# Patient Record
Sex: Female | Born: 1945 | Race: Black or African American | Hispanic: No
Health system: Southern US, Community
[De-identification: ages and names within clinical notes are randomized; demographics above are authoritative.]

## PROBLEM LIST (undated history)

## (undated) DIAGNOSIS — Z9289 Personal history of other medical treatment: Secondary | ICD-10-CM

## (undated) DIAGNOSIS — I10 Essential (primary) hypertension: Secondary | ICD-10-CM

## (undated) DIAGNOSIS — I639 Cerebral infarction, unspecified: Secondary | ICD-10-CM

## (undated) DIAGNOSIS — IMO0001 Reserved for inherently not codable concepts without codable children: Secondary | ICD-10-CM

## (undated) DIAGNOSIS — N184 Chronic kidney disease, stage 4 (severe): Secondary | ICD-10-CM

## (undated) DIAGNOSIS — E785 Hyperlipidemia, unspecified: Secondary | ICD-10-CM

## (undated) DIAGNOSIS — E041 Nontoxic single thyroid nodule: Secondary | ICD-10-CM

## (undated) DIAGNOSIS — Z794 Long term (current) use of insulin: Secondary | ICD-10-CM

## (undated) DIAGNOSIS — E119 Type 2 diabetes mellitus without complications: Secondary | ICD-10-CM

## (undated) HISTORY — PX: CATARACT EXTRACTION, BILATERAL: SHX1313

## (undated) HISTORY — DX: Hyperlipidemia, unspecified: E78.5

## (undated) HISTORY — DX: Personal history of other medical treatment: Z92.89

## (undated) HISTORY — DX: Essential (primary) hypertension: I10

## (undated) HISTORY — DX: Type 2 diabetes mellitus without complications: E11.9

## (undated) HISTORY — DX: Long term (current) use of insulin: Z79.4

## (undated) HISTORY — DX: Nontoxic single thyroid nodule: E04.1

## (undated) HISTORY — PX: ABDOMINAL HYSTERECTOMY: SHX81

## (undated) HISTORY — DX: Chronic kidney disease, stage 4 (severe): N18.4

## (undated) HISTORY — DX: Cerebral infarction, unspecified: I63.9

## (undated) HISTORY — DX: Reserved for inherently not codable concepts without codable children: IMO0001

---

## 2014-08-06 ENCOUNTER — Inpatient Hospital Stay: Payer: Self-pay | Admitting: Internal Medicine

## 2014-08-06 LAB — COMPREHENSIVE METABOLIC PANEL
ALT: 15 U/L
Albumin: 2.4 g/dL — ABNORMAL LOW (ref 3.4–5.0)
Alkaline Phosphatase: 134 U/L — ABNORMAL HIGH
Anion Gap: 10 (ref 7–16)
BILIRUBIN TOTAL: 0.3 mg/dL (ref 0.2–1.0)
BUN: 35 mg/dL — ABNORMAL HIGH (ref 7–18)
CREATININE: 2.44 mg/dL — AB (ref 0.60–1.30)
Calcium, Total: 9.4 mg/dL (ref 8.5–10.1)
Chloride: 106 mmol/L (ref 98–107)
Co2: 23 mmol/L (ref 21–32)
EGFR (African American): 23 — ABNORMAL LOW
EGFR (Non-African Amer.): 20 — ABNORMAL LOW
Glucose: 302 mg/dL — ABNORMAL HIGH (ref 65–99)
OSMOLALITY: 297 (ref 275–301)
Potassium: 4.2 mmol/L (ref 3.5–5.1)
SGOT(AST): 21 U/L (ref 15–37)
Sodium: 139 mmol/L (ref 136–145)
TOTAL PROTEIN: 7.2 g/dL (ref 6.4–8.2)

## 2014-08-06 LAB — HEMOGLOBIN A1C: HEMOGLOBIN A1C: 11.6 % — AB (ref 4.2–6.3)

## 2014-08-06 LAB — CBC
HCT: 32.4 % — AB (ref 35.0–47.0)
HGB: 10.4 g/dL — ABNORMAL LOW (ref 12.0–16.0)
MCH: 26.9 pg (ref 26.0–34.0)
MCHC: 32.1 g/dL (ref 32.0–36.0)
MCV: 84 fL (ref 80–100)
Platelet: 320 10*3/uL (ref 150–440)
RBC: 3.86 10*6/uL (ref 3.80–5.20)
RDW: 14 % (ref 11.5–14.5)
WBC: 9.4 10*3/uL (ref 3.6–11.0)

## 2014-08-06 LAB — TROPONIN I: TROPONIN-I: 0.03 ng/mL

## 2014-08-07 ENCOUNTER — Ambulatory Visit: Payer: Self-pay | Admitting: Neurology

## 2014-08-07 DIAGNOSIS — I059 Rheumatic mitral valve disease, unspecified: Secondary | ICD-10-CM

## 2014-08-07 LAB — URINALYSIS, COMPLETE
Bilirubin,UR: NEGATIVE
Glucose,UR: >=500 mg/dL (ref 0–75)
Ketone: NEGATIVE
NITRITE: NEGATIVE
PH: 5 (ref 4.5–8.0)
RBC,UR: 2 /HPF (ref 0–5)
Specific Gravity: 1.01 (ref 1.003–1.030)
Squamous Epithelial: 4
WBC UR: 80 /HPF (ref 0–5)

## 2014-08-07 LAB — BASIC METABOLIC PANEL
Anion Gap: 7 (ref 7–16)
BUN: 27 mg/dL — ABNORMAL HIGH (ref 7–18)
CALCIUM: 8.7 mg/dL (ref 8.5–10.1)
CHLORIDE: 113 mmol/L — AB (ref 98–107)
CREATININE: 2.23 mg/dL — AB (ref 0.60–1.30)
Co2: 24 mmol/L (ref 21–32)
EGFR (Non-African Amer.): 22 — ABNORMAL LOW
GFR CALC AF AMER: 26 — AB
Glucose: 158 mg/dL — ABNORMAL HIGH (ref 65–99)
Osmolality: 295 (ref 275–301)
POTASSIUM: 3.9 mmol/L (ref 3.5–5.1)
SODIUM: 144 mmol/L (ref 136–145)

## 2014-08-07 LAB — LIPID PANEL
Cholesterol: 412 mg/dL — ABNORMAL HIGH (ref 0–200)
HDL Cholesterol: 45 mg/dL (ref 40–60)
LDL CHOLESTEROL, CALC: 326 mg/dL — AB (ref 0–100)
Triglycerides: 204 mg/dL — ABNORMAL HIGH (ref 0–200)
VLDL Cholesterol, Calc: 41 mg/dL — ABNORMAL HIGH (ref 5–40)

## 2014-08-07 LAB — CBC WITH DIFFERENTIAL/PLATELET
BASOS ABS: 0.1 10*3/uL (ref 0.0–0.1)
BASOS PCT: 1.1 %
EOS ABS: 0.3 10*3/uL (ref 0.0–0.7)
EOS PCT: 4.9 %
HCT: 27.8 % — ABNORMAL LOW (ref 35.0–47.0)
HGB: 9.4 g/dL — ABNORMAL LOW (ref 12.0–16.0)
Lymphocyte #: 2.2 10*3/uL (ref 1.0–3.6)
Lymphocyte %: 31.9 %
MCH: 27.9 pg (ref 26.0–34.0)
MCHC: 33.8 g/dL (ref 32.0–36.0)
MCV: 83 fL (ref 80–100)
MONO ABS: 0.5 x10 3/mm (ref 0.2–0.9)
Monocyte %: 7.4 %
NEUTROS PCT: 54.7 %
Neutrophil #: 3.7 10*3/uL (ref 1.4–6.5)
PLATELETS: 270 10*3/uL (ref 150–440)
RBC: 3.36 10*6/uL — ABNORMAL LOW (ref 3.80–5.20)
RDW: 14.5 % (ref 11.5–14.5)
WBC: 6.8 10*3/uL (ref 3.6–11.0)

## 2014-08-07 LAB — MAGNESIUM: Magnesium: 1.9 mg/dL

## 2014-08-09 LAB — T4, FREE: Free Thyroxine: 0.81 ng/dL (ref 0.76–1.46)

## 2014-08-09 LAB — TSH: Thyroid Stimulating Horm: 0.974 u[IU]/mL

## 2014-08-10 LAB — URINE CULTURE

## 2014-08-11 ENCOUNTER — Telehealth: Payer: Self-pay | Admitting: *Deleted

## 2014-08-11 DIAGNOSIS — I639 Cerebral infarction, unspecified: Secondary | ICD-10-CM

## 2014-08-11 NOTE — Telephone Encounter (Signed)
Informed patient that Dr. Kirke CorinArida has ordered a 30 day event monitor for her  Told her that [facility] will be contacting her to set it up  Provided my name and number incase she has questions or does not hear from [facility]  Patient verbalized understanding

## 2014-08-12 DIAGNOSIS — Z8673 Personal history of transient ischemic attack (TIA), and cerebral infarction without residual deficits: Secondary | ICD-10-CM

## 2014-08-13 ENCOUNTER — Telehealth: Payer: Self-pay

## 2014-08-13 NOTE — Telephone Encounter (Signed)
Patient contacted regarding discharge from [facility] on 08/10/14.  Patient understands to follow up with Ward Givenshris Berge, NP on 09/08/14 at 1:15 [facility]. Patient understands discharge instructions? yes Patient understands medications and regiment? yes Patient understands to bring all medications to this visit? yes

## 2014-09-01 ENCOUNTER — Encounter: Payer: Self-pay | Admitting: *Deleted

## 2014-09-07 ENCOUNTER — Encounter: Payer: Self-pay | Admitting: Nurse Practitioner

## 2014-09-07 ENCOUNTER — Encounter: Payer: Self-pay | Admitting: *Deleted

## 2014-09-07 ENCOUNTER — Encounter: Payer: Medicare Other | Admitting: Nurse Practitioner

## 2014-09-08 ENCOUNTER — Encounter: Payer: Self-pay | Admitting: Nurse Practitioner

## 2014-09-10 ENCOUNTER — Other Ambulatory Visit: Payer: Self-pay

## 2014-09-10 ENCOUNTER — Ambulatory Visit (INDEPENDENT_AMBULATORY_CARE_PROVIDER_SITE_OTHER): Payer: Medicare Other

## 2014-09-10 ENCOUNTER — Telehealth: Payer: Self-pay | Admitting: *Deleted

## 2014-09-10 DIAGNOSIS — Z8673 Personal history of transient ischemic attack (TIA), and cerebral infarction without residual deficits: Secondary | ICD-10-CM

## 2014-09-10 DIAGNOSIS — I639 Cerebral infarction, unspecified: Secondary | ICD-10-CM

## 2014-09-10 NOTE — Telephone Encounter (Signed)
Attempted to call patient to let her know that her event monitor showed  NSR with rare PAC's  No afib  Telephone number was disconnected

## 2014-09-13 ENCOUNTER — Encounter: Payer: Medicare Other | Admitting: Physician Assistant

## 2014-09-13 NOTE — Progress Notes (Signed)
Entered in error This encounter was created in error - please disregard. 

## 2014-09-15 NOTE — Telephone Encounter (Signed)
Attempted to call patient again  Phone number remains disconnected

## 2014-09-17 ENCOUNTER — Encounter: Payer: Self-pay | Admitting: *Deleted

## 2014-09-17 ENCOUNTER — Encounter: Payer: Medicare Other | Admitting: Physician Assistant

## 2014-09-17 ENCOUNTER — Encounter: Payer: Self-pay | Admitting: Physician Assistant

## 2014-09-17 NOTE — Progress Notes (Signed)
Opened in error  This encounter was created in error - please disregard. 

## 2014-10-01 ENCOUNTER — Emergency Department: Payer: Self-pay | Admitting: Student

## 2014-10-01 LAB — URINALYSIS, COMPLETE
BLOOD: NEGATIVE
Bacteria: NONE SEEN
Bilirubin,UR: NEGATIVE
Glucose,UR: >=500 mg/dL (ref 0–75)
Ketone: NEGATIVE
LEUKOCYTE ESTERASE: NEGATIVE
Nitrite: NEGATIVE
Ph: 7 (ref 4.5–8.0)
Protein: >=500
Specific Gravity: 1.011 (ref 1.003–1.030)
Squamous Epithelial: 1
WBC UR: 1 /HPF (ref 0–5)

## 2014-10-01 LAB — CBC
HCT: 31.2 % — ABNORMAL LOW (ref 35.0–47.0)
HGB: 10.3 g/dL — AB (ref 12.0–16.0)
MCH: 27 pg (ref 26.0–34.0)
MCHC: 32.9 g/dL (ref 32.0–36.0)
MCV: 82 fL (ref 80–100)
PLATELETS: 296 10*3/uL (ref 150–440)
RBC: 3.8 10*6/uL (ref 3.80–5.20)
RDW: 14.3 % (ref 11.5–14.5)
WBC: 8.7 10*3/uL (ref 3.6–11.0)

## 2014-10-01 LAB — COMPREHENSIVE METABOLIC PANEL
ALBUMIN: 2.4 g/dL — AB (ref 3.4–5.0)
ANION GAP: 9 (ref 7–16)
Alkaline Phosphatase: 162 U/L — ABNORMAL HIGH
BUN: 35 mg/dL — ABNORMAL HIGH (ref 7–18)
Bilirubin,Total: 0.3 mg/dL (ref 0.2–1.0)
Calcium, Total: 8.9 mg/dL (ref 8.5–10.1)
Chloride: 101 mmol/L (ref 98–107)
Co2: 22 mmol/L (ref 21–32)
Creatinine: 2.19 mg/dL — ABNORMAL HIGH (ref 0.60–1.30)
EGFR (African American): 29 — ABNORMAL LOW
EGFR (Non-African Amer.): 24 — ABNORMAL LOW
GLUCOSE: 583 mg/dL — AB (ref 65–99)
Osmolality: 299 (ref 275–301)
Potassium: 4.5 mmol/L (ref 3.5–5.1)
SGOT(AST): 20 U/L (ref 15–37)
SGPT (ALT): 12 U/L — ABNORMAL LOW
SODIUM: 132 mmol/L — AB (ref 136–145)
Total Protein: 7.3 g/dL (ref 6.4–8.2)

## 2015-02-11 IMAGING — CT CT HEAD WITHOUT CONTRAST
1 of 2 series · 15 of 30 positions shown, 19 images · non-contrast
Comparison: None.

CLINICAL DATA: 67-year-old female found down. Left facial weakness
and abnormal speech. Diabetes. Initial encounter.

EXAM:
CT HEAD WITHOUT CONTRAST
TECHNIQUE: Contiguous axial images were obtained from the base of the skull
through the vertex without intravenous contrast.

[Series 2: head wo · axial · 0.43mm/px · z∈[-112,+18]mm · 15 of 30 slices shown, 19 images]
[im 2/30  brain]
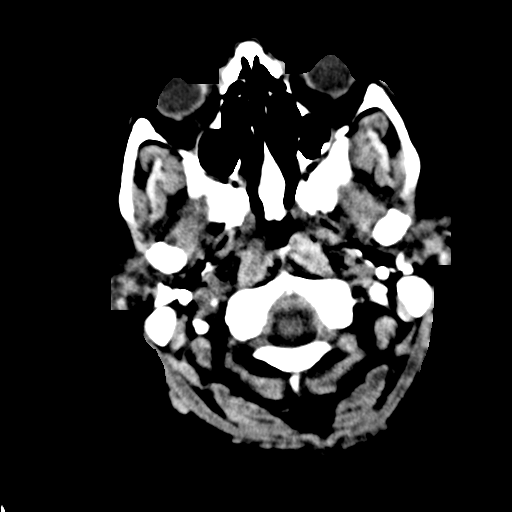
[im 2/30  bone]
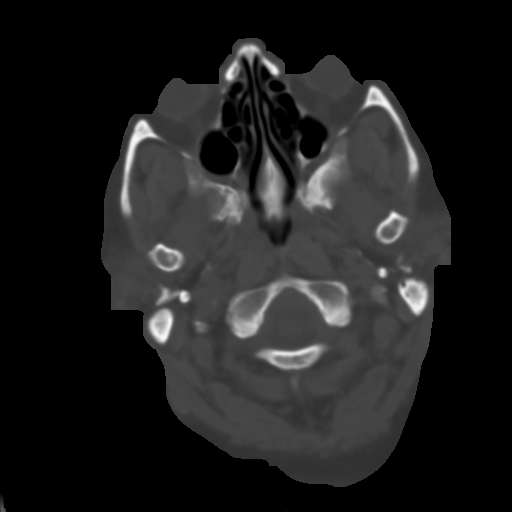
[im 3/30  brain]
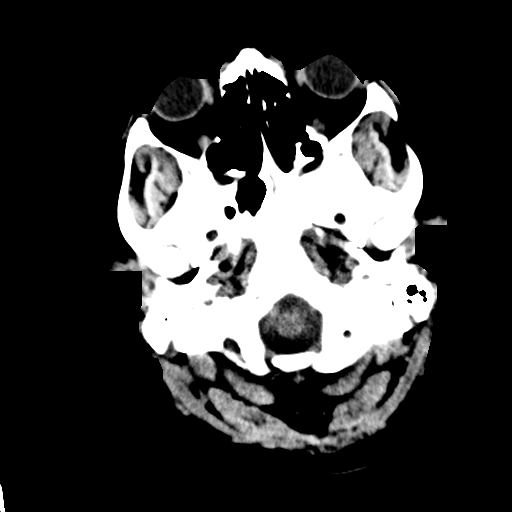
[im 6/30  brain]
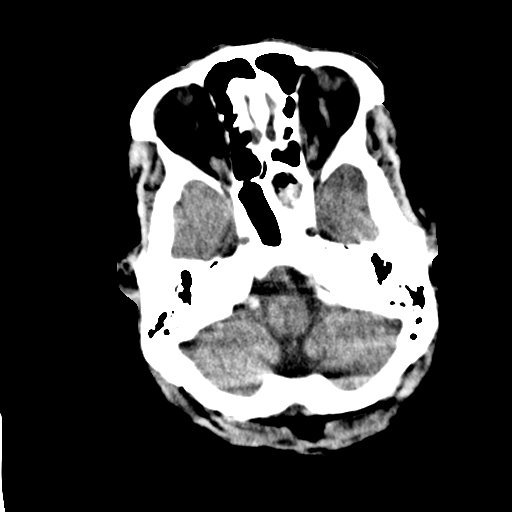
[im 8/30  brain]
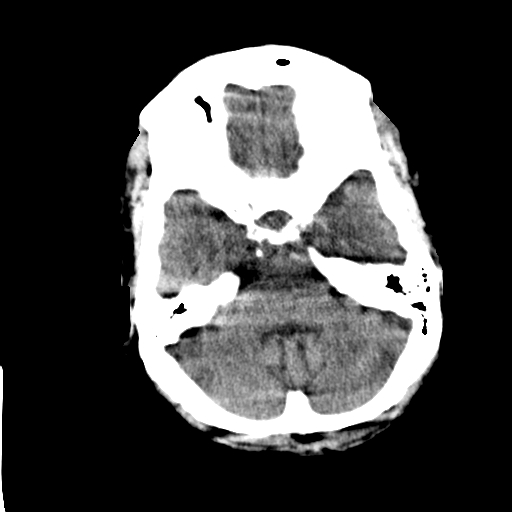
[im 9/30  brain]
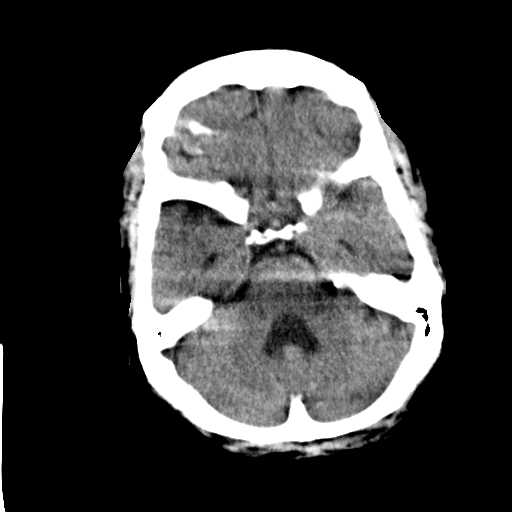
[im 9/30  bone]
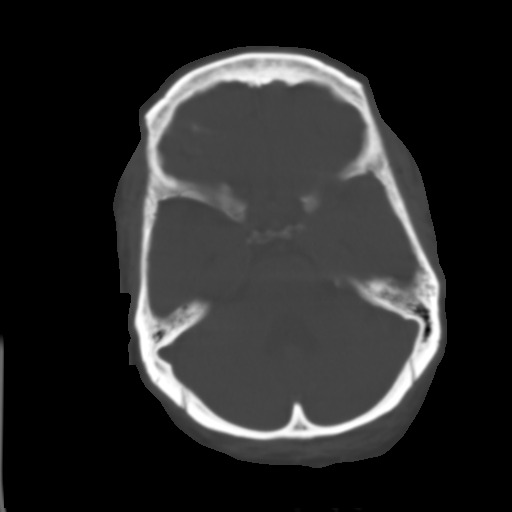
[im 11/30  brain]
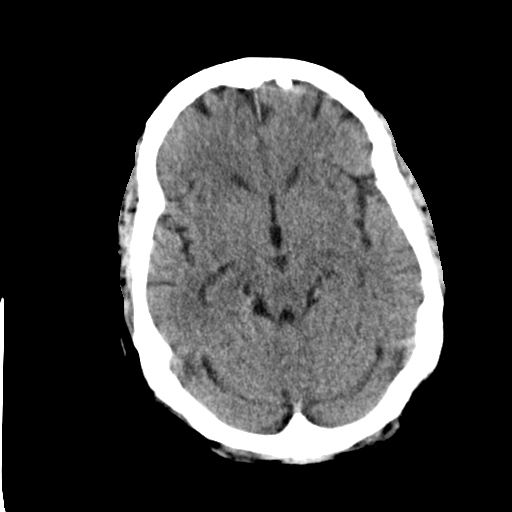
[im 14/30  brain]
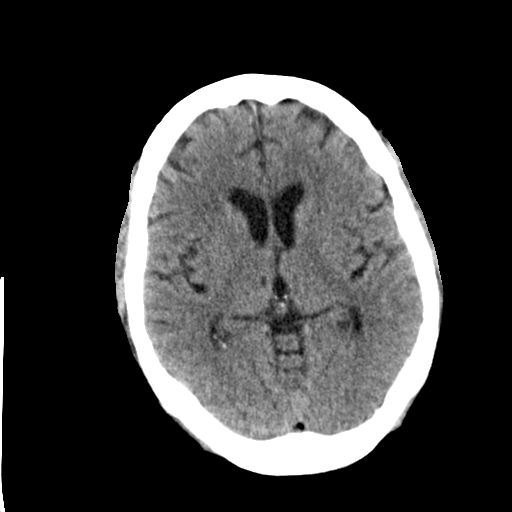
[im 15/30  brain]
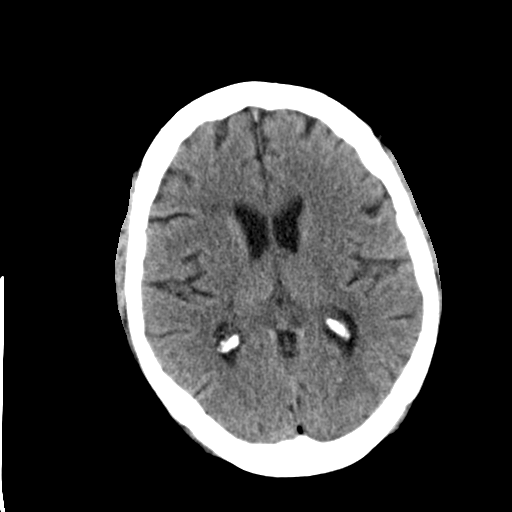
[im 16/30  brain]
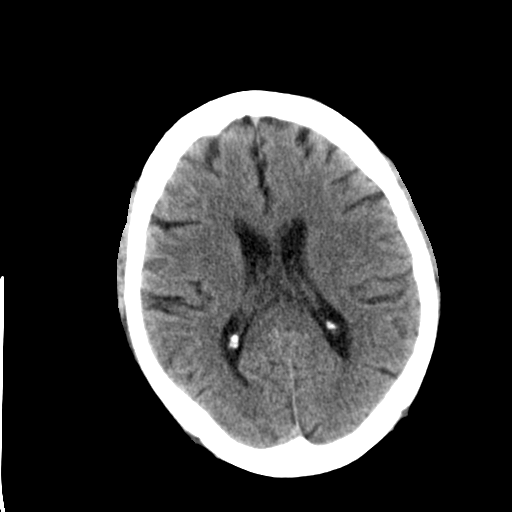
[im 16/30  bone]
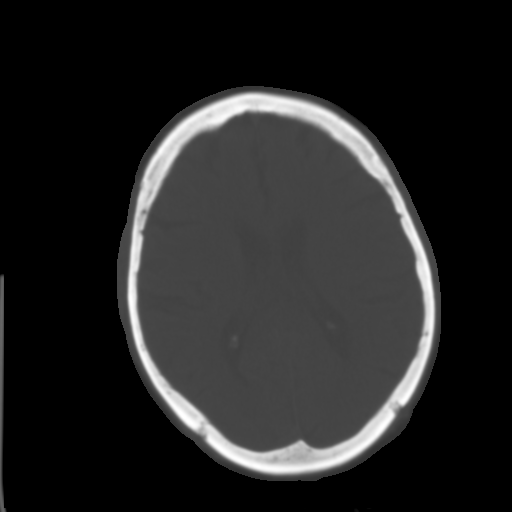
[im 19/30  brain]
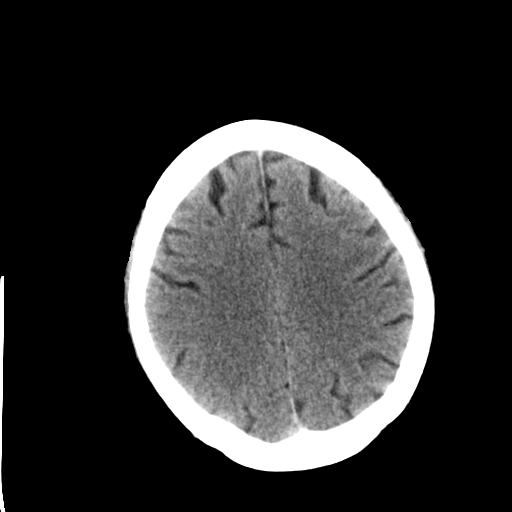
[im 21/30  brain]
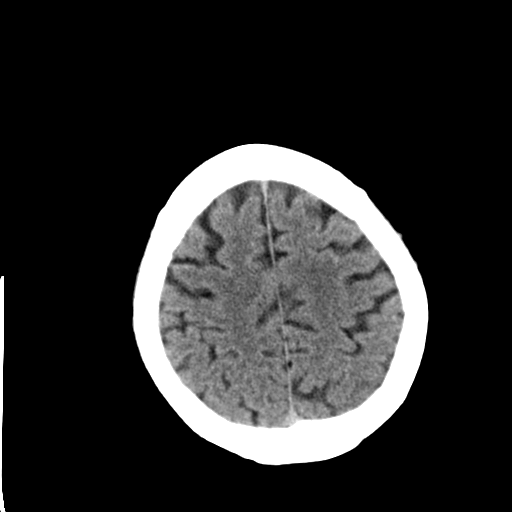
[im 22/30  brain]
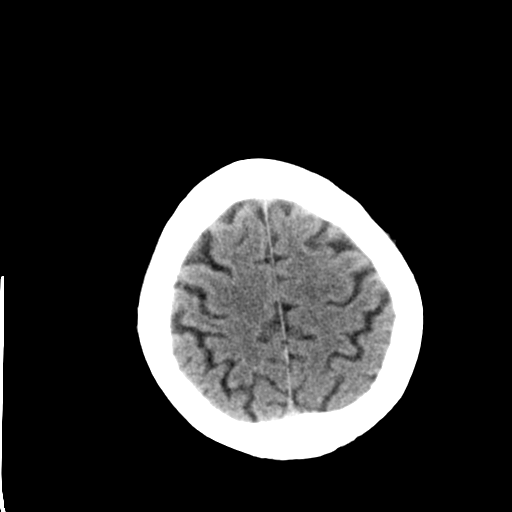
[im 24/30  brain]
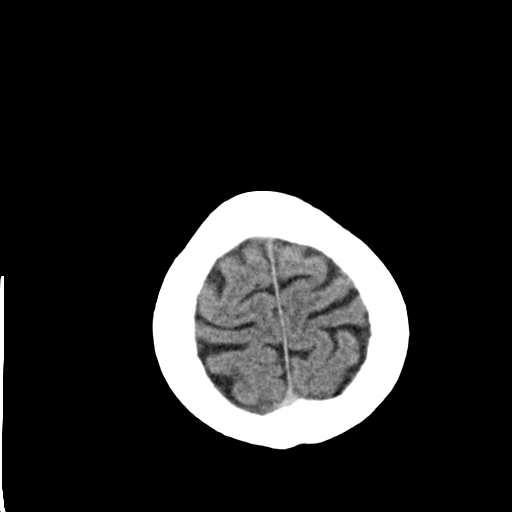
[im 24/30  bone]
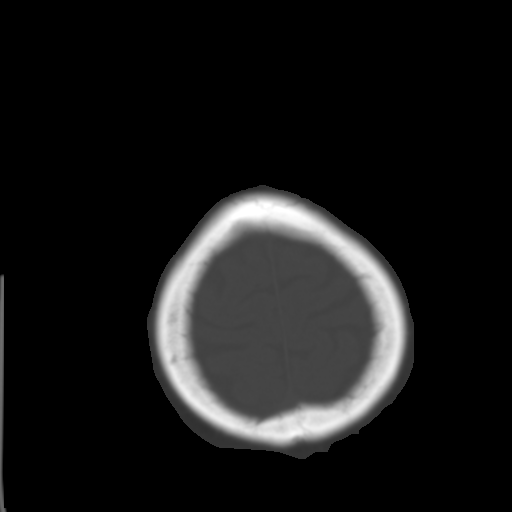
[im 27/30  brain]
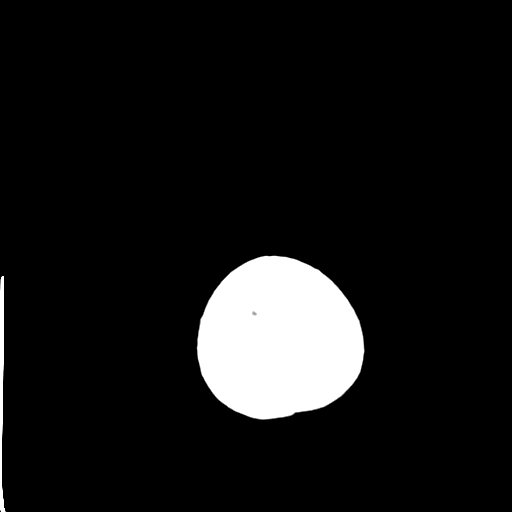
[im 28/30  brain]
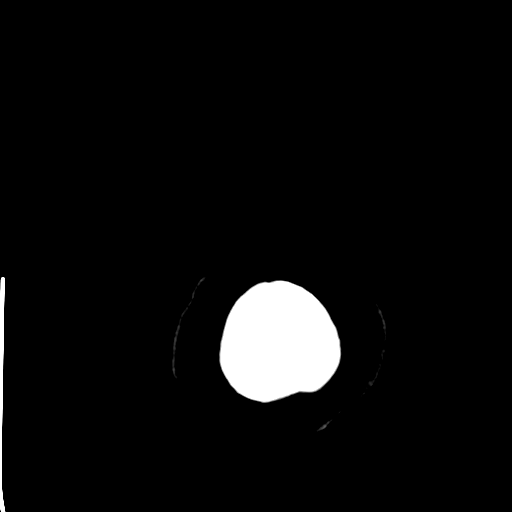

[15 of 30 positions shown; findings below may reference images not displayed]

FINDINGS: Chronic left sphenoid sinusitis. Other Visualized paranasal sinuses
and mastoids are clear. No acute osseous abnormality identified.

Visualized scalp soft tissues are within normal limits. Negative
orbits soft tissues.

Calcified atherosclerosis at the skull base. No suspicious
intracranial vascular hyperdensity. No evidence of cortically based
acute infarction identified. No midline shift, mass effect, or
evidence of intracranial mass lesion. No acute intracranial
hemorrhage identified. No ventriculomegaly. Normal gray-white matter
differentiation except for a small hypodensity in the medial right
thalamus.
IMPRESSION: 1. No acute cortically based infarct identified. No intracranial
hemorrhage or mass effect.
2. Four mm hypodensity medial right thalamus suspicious for acute
lacunar infarct in this setting, but could be a normal perivascular
space.

## 2015-02-12 IMAGING — US THYROID ULTRASOUND
1 series · 14 of 25 positions shown · non-contrast
Comparison: None.

CLINICAL DATA: Thyroid nodules

EXAM:
THYROID ULTRASOUND
TECHNIQUE: Ultrasound examination of the thyroid gland and adjacent soft
tissues was performed.

[Series 1: thyroid ultrasound · 0.07mm/px · 14 of 43 slices shown]
[im 1/43]
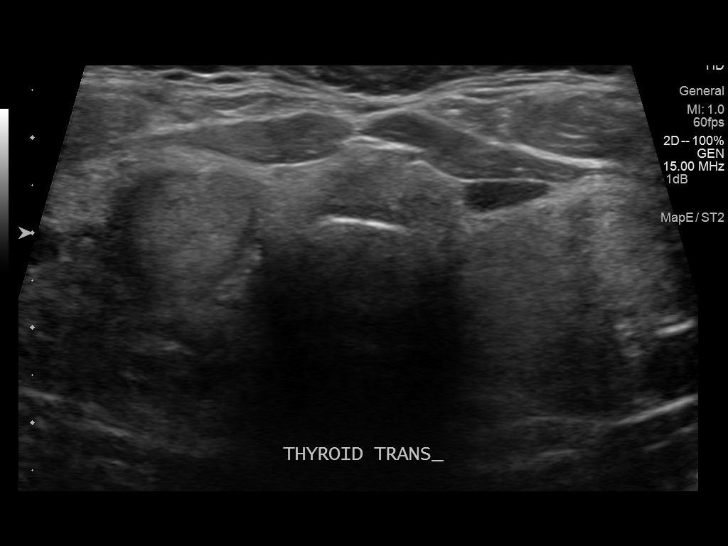
[im 4/43]
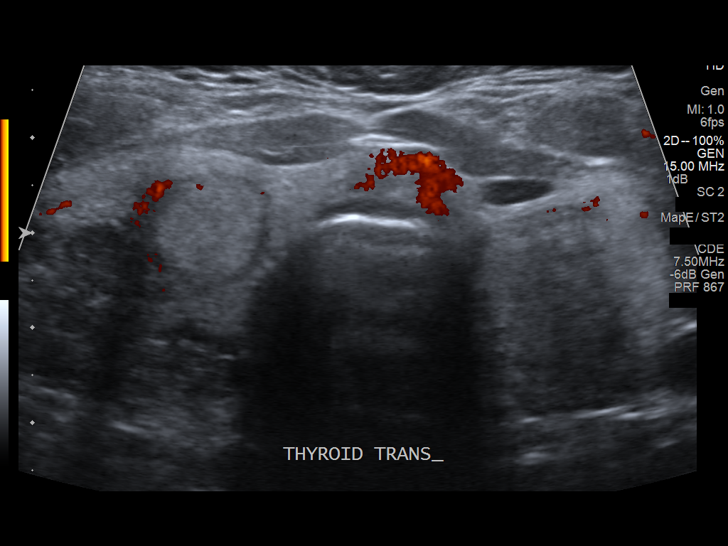
[im 8/43]
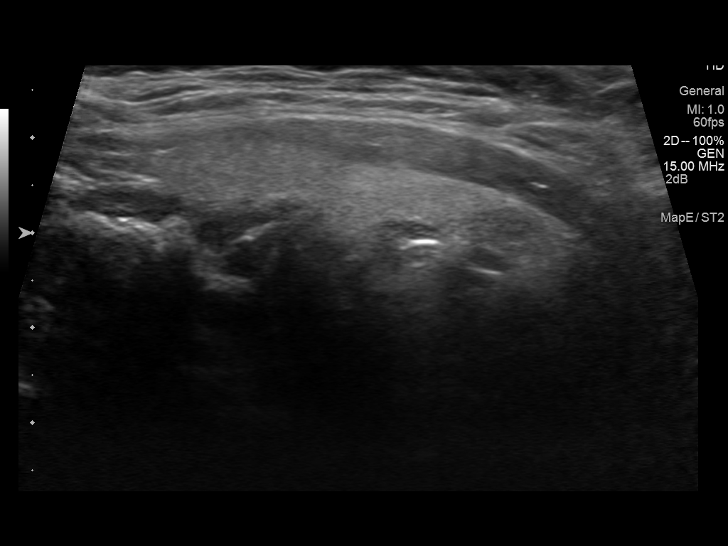
[im 11/43]
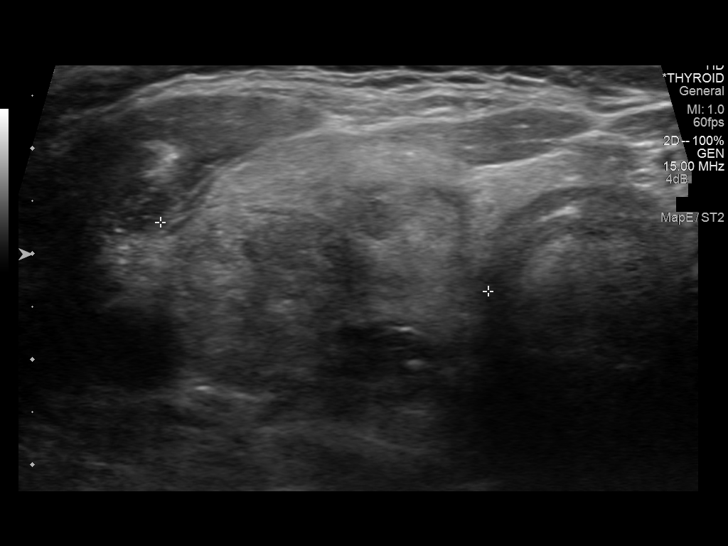
[im 15/43]
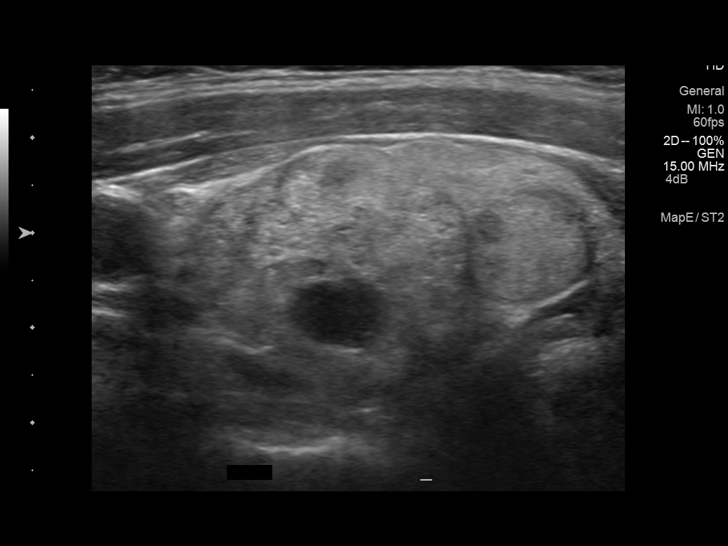
[im 16/43]
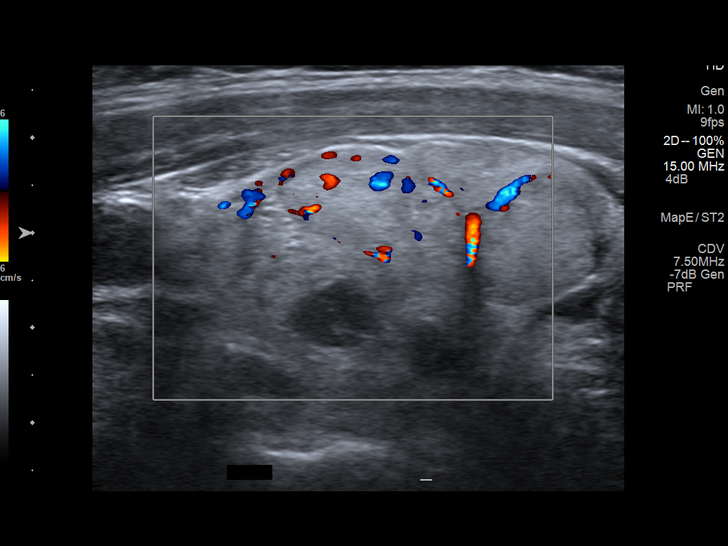
[im 20/43]
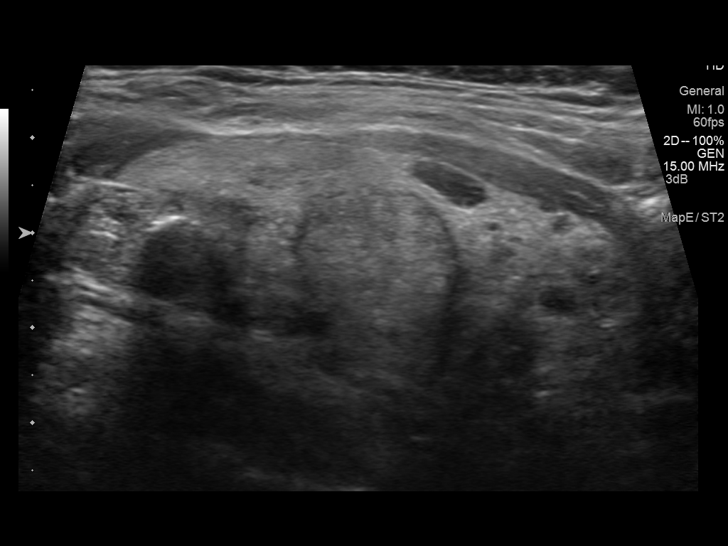
[im 23/43]
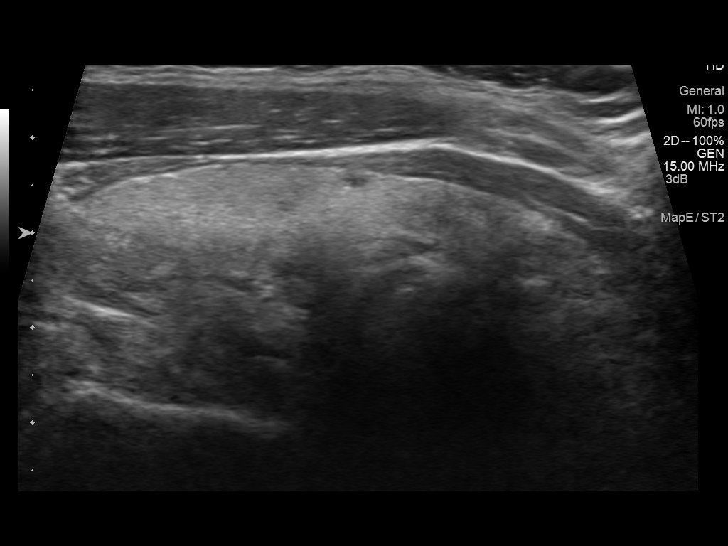
[im 27/43]
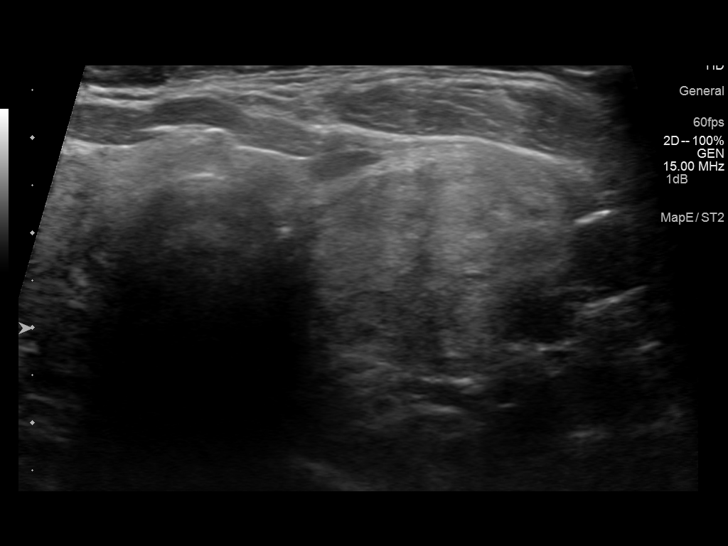
[im 29/43]
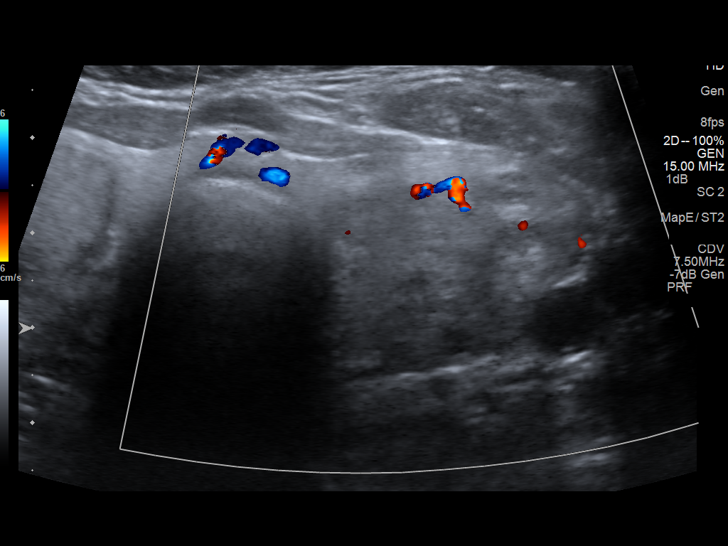
[im 32/43]
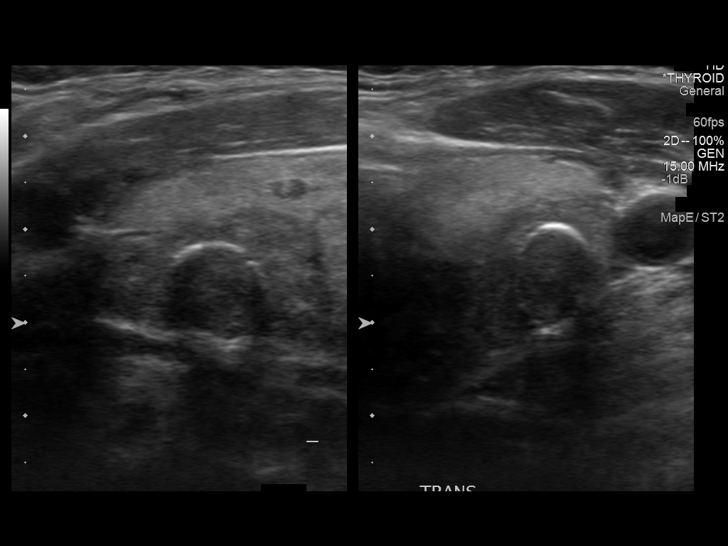
[im 36/43]
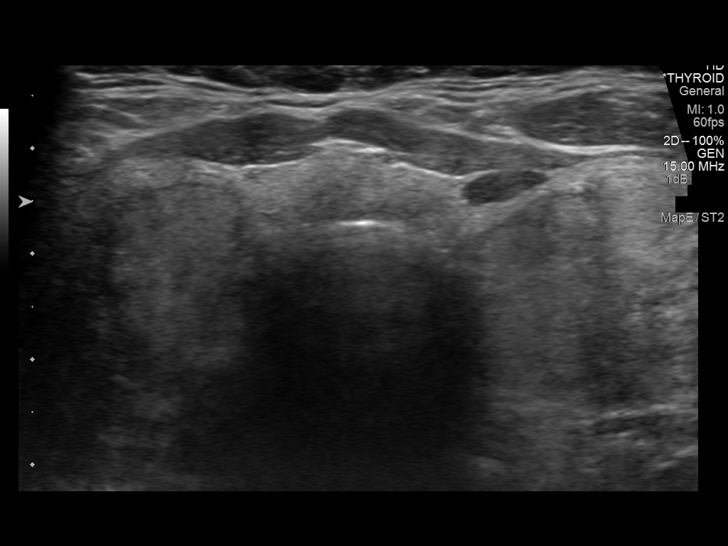
[im 39/43]
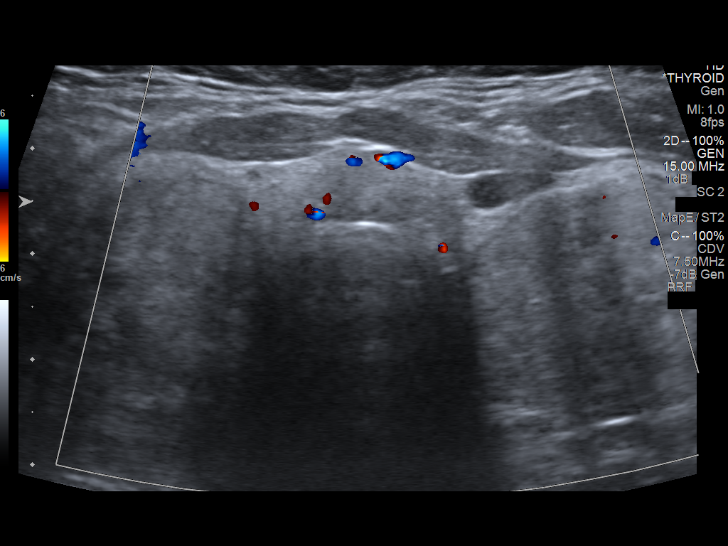
[im 43/43]
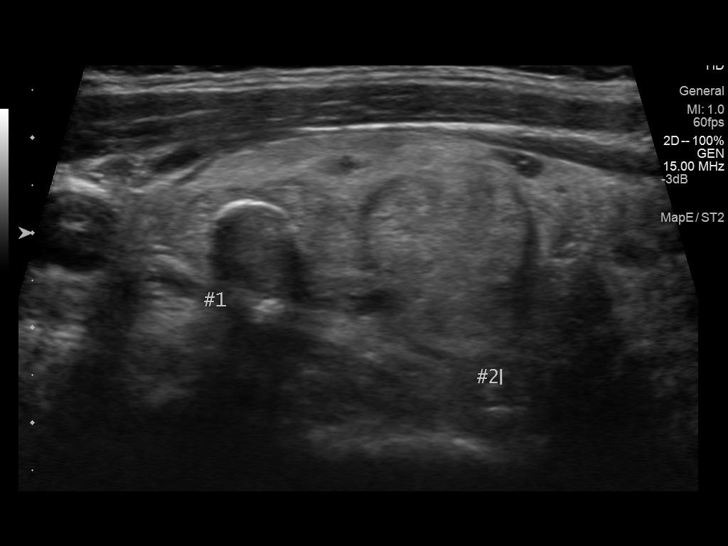

[14 of 25 positions shown; findings below may reference images not displayed]

FINDINGS: Right thyroid lobe

Measurements: 5.7 x 3.1 x 3.2 cm.. Multiple solid-appearing nodules.
The largest measures 2.8 cm.

Left thyroid lobe

Measurements: 6.0 x 2.3 x 3.0 cm.. Multiple solid-appearing nodules.
Large measures 1.9 cm.

Isthmus

Thickness: 0.6.  No nodules visualized.

Lymphadenopathy

None visualized.
IMPRESSION: Bilateral solid appearing thyroid nodules. These nodules meet size
criteria for percutaneous biopsy recommendation.

## 2015-04-16 NOTE — H&P (Signed)
PATIENT NAME:  Lisa Estes, Lisa Estes MR#:  [id number] DATE OF BIRTH:  10/12/1946  DATE OF ADMISSION:  08/06/2014  REFERRING PHYSICIAN: Cecille Amsterdam. Mayford Knife, MD.  PRIMARY CARE PHYSICIAN: Currently having none; recently moved from [place], but has an appointment scheduled for next week with Dr. Dewaine Oats.   CHIEF COMPLAINT: Left facial droop and slurred speech.   HISTORY OF PRESENT ILLNESS: This is a 69 year old female with known history of diabetes, hypertension, hyperlipidemia who presents with left facial droop and slurred speech.  The patient was seen by her sister today, where she was noticed to have these changes. The patient is unaware of these changes. As well, she was seen by another sister yesterday and to a lesser degree, as well, she had a left facial droop but not that severe as per the family. The patient, herself, is not aware of any of these changes. She denies any focal deficits, tingling, numbness as well. There is no altered mental status. No loss of consciousness. No fall. No history of CVA in the past. The patient was given 324 of aspirin in the ED. She had a CT head without contrast in the ED, which did not show any acute cortical base infarct, but it did show 4 mm hypodensity medial right thalamus suspicious for acute lacunar infarct. So, hospitalist requested to admit the patient for further evaluation and treatment.  She denies any chest pain, any shortness of breath, any fever, any chills. The patient was noticed to have a creatinine of 2.4. The patient is unaware of her baseline, but she reports she has been having good oral intake and appropriate hydration.   The patient has been running out of her medication as she recently moved from [place] to here where she has been for 3 months; however, she has not been seen by PCP yet, but she is supposed to see Dr. Dewaine Oats next week so she ran out of some medications.  PAST MEDICAL HISTORY: 1. Diabetes.  2. Hypertension.  3.  Hyperlipidemia.   PAST SURGICAL HISTORY: 1. Hysterectomy.  2. Bilateral cataract surgery.   FAMILY HISTORY: Denies any history of CVA. History of diabetes and cancer.   ALLERGIES: No known drug allergies.   HOME MEDICATIONS: Apparently, the patient ran out of most of her medications. She has been here for 3 months with no refills.  1. Aspirin 81 mg daily. 2. Losartan 100 mg oral daily. 3. Humulin insulin sliding scale.  4. Garlic oil 1 tablet daily.  5. Glucosamine 1 tablet daily.  6. Weight loss supplement 1 tablet daily. The patient has a list as well where she is supposed to be on these medications, but she has not been taking: 1. Hydrochlorothiazide 25 mg daily. 2. Atorvastatin 80 mg daily. 3. Glyburide/metformin 500 mg 4 tablets daily.   REVIEW OF SYSTEMS: CONSTITUTIONAL: Denies fever, chills, fatigue, weakness.  EYES: Denies blurry vision, double vision, inflammation, glaucoma.  ENT: Denies tinnitus, ear pain, hearing loss, epistaxis or discharge.  RESPIRATORY: Denies cough, wheezing, hemoptysis, COPD.  CARDIOVASCULAR: Denies chest pain, edema, arrhythmia, palpitations, syncope.  GASTROINTESTINAL: Denies nausea, vomiting, diarrhea, abdominal pain, hematemesis.  GENITOURINARY: Denies dysuria, hematuria, or renal colic.  ENDOCRINE: Denies polyuria, polydipsia, heat or cold intolerance.  HEMATOLOGY: Denies anemia, easy bleeding .  INTEGUMENTARY: Denies acne, rash, or skin lesion.  MUSCULOSKELETAL: Denies any gout, redness, arthritis, cramps.  NEUROLOGIC: Denies any history of CVA, TIA, tremors, vertigo, ataxia in the past. Denies any focal tingling, numbness. The patient had slurred speech with  left facial droop. PSYCHIATRIC: Denies anxiety, insomnia, or depression.   PHYSICAL EXAMINATION: VITAL SIGNS: Temperature 98.1, pulse 105, respiratory rate 20, blood pressure upon presentation to 227/103, it dropped without any intervention to 180/90.  GENERAL: Morbidly obese female  who looks comfortable, in no apparent distress.  HEENT: Head atraumatic, has left facial droop. Pink conjunctivae. Anicteric sclerae. Moist oral mucosa.   NECK: Supple. No thyromegaly. No JVD. No carotid bruits.  CHEST: Good air entry bilaterally. No wheezing, rales or rhonchi. No use of accessory muscle.  CARDIOVASCULAR: S1, S2 heard. No rubs, murmur, or gallops. PMI is nondisplaced.  ABDOMEN: Obese, soft, nontender, nondistended. Bowel sounds present.  EXTREMITIES: +2 edema bilaterally. No clubbing. No cyanosis. Pedal pulses felt bilaterally.  PSYCHIATRIC: Appropriate affect. Awake, alert x 3. Intact judgment and insight.  NEUROLOGIC: Has left facial droop. Speech is slightly slurred.  EXTREMITIES: Bilateral motor 5/5 bilaterally with no focal deficits. Sensation is intact and symmetrical to light touch bilaterally.  MUSCULOSKELETAL: No joint effusion or erythema. No CVA tenderness.   PERTINENT LABORATORY DATA: Glucose 302. BUN 35, creatinine 2.44, sodium 139, potassium 4.2, chloride 106, CO2 23. White blood cells 9.4, hemoglobin 10.4, platelets 320,000.   IMAGING STUDIES: CT head without contrast: No acute cortically based infarct, 4 mm hypodensity medial right thalamus suspicious for acute lacunar infarct.   ASSESSMENT AND PLAN: 1. Acute cerebrovascular accident. The patient presents with a left facial droop and slurred speech with evidence of lacunar infarct. She will be continued on 325 mg of aspirin. We will consult Neurology. Will obtain MRI of the brain without contrast. We will have carotid Doppler bilaterally and we will keep her neurologic checks at every 4 hours. We will keep n.p.o. until she is evaluated by speech and swallow.  2. Renal failure, unclear if acute or chronic. We will request medical records to see if it is acute or chronic, but meanwhile, we will hold all of her nephrotoxic medication. We will check renal ultrasound and will continue with gentle hydration. 3.   Hypertension. Blood pressure is elevated, but we will hold all of her medications as to allow for permissive hypertension. We will keep her on as-needed hydralazine for systolic more than 200.  4. Diabetes mellitus. Given the patient is nothing-by-mouth awaiting dysphagia evaluation, we will keep her on insulin sliding scale.  5. Hyperlipidemia. We will resume back on statin. Will check lipid panel. 6. Deep vein thrombosis prophylaxis. Subcutaneous heparin.   CODE STATUS: Full code. Discussed with the patient and her sister at bedside.  Total time spent on admission and patient care is 55 minutes.   ____________________________ Starleen Armsawood S. Wolfe Camarena, MD dse:NT D: 08/06/2014 16:41:45 ET T: 08/06/2014 17:30:05 ET JOB#: [id number]  cc: Starleen Armsawood S. Darol Cush, MD, <Dictator> Matilde Pottenger Teena IraniS Ibtisam Benge MD ELECTRONICALLY SIGNED 08/12/2014 4:15

## 2015-04-16 NOTE — Discharge Summary (Signed)
PATIENT NAME:  Lisa Estes, Lisa MR#:  [id number] DATE OF BIRTH:  23-Jan-1946  DATE OF ADMISSION:  08/06/2014 DATE OF DISCHARGE:  08/10/2014  PRIMARY CARE PHYSICIAN: The patient has recently moved from [place] and scheduled an appointment with Elray Bubaanny Tate on 08/25/2014.   CHIEF COMPLAINT AT THE TIME OF ADMISSION: Left facial droop and slurred speech.   ADMITTING DIAGNOSES:  1.  Acute cerebrovascular accident.  2.  Renal failure, unclear if it is acute or chronic.   DISCHARGE DIAGNOSES:  1.  Acute cerebrovascular accident with multiple infarcts. The patient has to take aspirin 81 mg once daily and Plavix 75 mg once daily for 90 days at least.  2.  Renal failure, probably chronic. Outpatient followup with nephrology is recommended and appointment was made.  3.  Diabetes mellitus, uncontrolled.  4.  Hyperlipidemia.  5.  Hypertension.  6.  Thyroid nodule. The patient needs outpatient ultrasound-guided thyroid biopsy after 90 days of completing aspirin and Plavix. Need to follow up with Dr. Loretha BrasilZeylikman.   CONSULTATIONS: Dr. Loretha BrasilZeylikman, Dr. Kirke CorinArida regarding TEE.   PROCEDURES: TEE was done by Dr. Kirke CorinArida.   BRIEF HISTORY AND HOSPITAL COURSE: The patient is a 69 year old female who came into the ED with a chief complaint of left facial droop and slurred speech. Please review history and physical for details. CAT scan of the head in the ED has revealed no acute infarct, but did show a 4 mm hypodensity in the medial right thalamus suspicious for acute lacunar infarct. The patient was recently moved from [place] and was not taking medications for the past 3 months. She just scheduled an appointment with Dr. Elray Bubaanny Tate, a new primary care physician on September 2. The patient was admitted to the hospital with a diagnosis of acute CVA and started her on aspirin 325 mg once daily. MRA of the brain was ordered. Carotid Dopplers and 2-D echocardiogram were done. Subsequently, the patient was seen by neurology. The  patient has multiple infarcts regarding the MRA of the brain that was done, which has revealed more than 70% stenosis of the posterior cerebral artery. TEE was ordered as recommended by neurology, as there was  consent for a shunt. Apparently, the TEE did not reveal any shunt, and an embolic stroke was ruled out. The patient is to get event monitor evaluation for 30 days as recommended by neurology. This was discussed with Dr. Kirke CorinArida, who has agreed to mail event monitor to the patient's home after discharge with instructions. The patient is to continue using the monitor for 30 days.   The patient was evaluated by physical therapy, who also recommended home with home physical therapy and  speech therapy. Oral dysphagia was ruled out. Dysarthria was significantly improved. Left facial droop was improved. The patient had a barium swallow evaluation on August 18 which did not reveal any aspiration, and speech pathology has recommended a mechanical soft diet with thin liquids and aspiration precautions. Case management was consulted to arrange home health with home PT.   The patient's diabetes mellitus was not controlled at all with hemoglobin A1c at 11.6. The patient is started on Novolin 70/30 with 10 units subcutaneously twice a day. The patient was provided with diabetic education and insulin training. The patient is to follow up with the outpatient diabetic clinic.   Hyperlipidemia. LDL is at 328. The patient is restarted on a statin at 20 mg.   Chronic renal failure, regarding which the patient is to follow up with nephrology as an outpatient.  I have personally discussed with Dr. Cherylann Ratel and an appointment was made.   Carotid Dopplers have revealed some abnormal thyroid with heterogenicity, regarding which thyroid ultrasound was done. They have recommended ultrasound-guided thyroid biopsy. This was discussed with Dr. Loretha Brasil. At this point as patient has acute stroke, we are not comfortable to hold her  aspirin for the procedure. Dr. Loretha Brasil has recommended the patient to continue aspirin 81 mg once daily and Plavix 75 mg once daily for 90 days. After 90 days, the patient needs to follow up with Dr. Loretha Brasil. At that point, he might consider holding the aspirin and Plavix temporarily for the procedure.   The patient was complaining of dysuria, regarding which a urinalysis was checked. Urine cultures have revealed no significant growth as per microbiology today. Oral condition is stable. Discharging home with home PT in stable condition.   ACTIVITY: As recommended by physical therapy.   DIET: Low-fat, low-cholesterol, diabetic diet.   FOLLOWUP: Dr. Arlana Pouch on 09/02. Follow up with neurology in 4 weeks. Follow up with Dr. Kirke Corin as needed.   SIGNIFICANT LABORATORIES AND IMAGING STUDIES: TEE which was performed on August 17 has revealed a left ventricular ejection fraction demonstrated 50% to 55%. No evidence of pericardial effusion. No intracardiac thrombi or masses were visualized. A bubble study has revealed no evidence of any intracardiac shunt. A CAT scan of the head without contrast on August 14 has revealed no acute cortically-based infarct identified. No intracranial hemorrhage or mass effect. A 4 mm hypodensity in the medial right thalamus suspicious for acute lacunar infarct. MRI of the brain without contrast, small acute posterior circulation infarct involving the brain stem and deep cerebral white matter on the right. Mild chronic small vessel ischemic disease and remote right thalamic lacunar infarct. 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 The stenosis is worse on the right than on the left. Advanced atherosclerotic irregularity and narrowing in both carotid siphons reveals affecting the anterior and middle cerebral arteries diffusely.  Ultrasound of the carotid Dopplers,  mild diffuse disease in the carotid arteries bilaterally.  Estimated degree of stenosis in the right internal carotid artery is less than 50%. Thyroid tissue is heterogenous and cannot exclude nodules. Recommended thyroid ultrasound. Ultrasound of the tissues of the neck was done on August 15. Bilateral solid-appearing thyroid nodules. These nodules meet size criteria for percutaneous biopsy recommendations.   DISCHARGE MEDICATIONS: Aspirin 81 mg once daily, Plavix 75 mg once daily, glucosamine 500 mg 1 tablet p.o. 2 times a day, garlic oil 500 mg orally 2 times a day, green tea slim 1 tablet p.o. once daily, metformin 500 mg 2 tablets p.o. 2 times a day, losartan 100 mg 1 tablet p.o. once a day along with 50 mg, she has 150 mg losartan (100 + 50) once daily, simvastatin 20 mg once daily, insulin 70/30 with 10 units subcutaneously 2 times a day.   FOLLOWUP APPOINTMENTS: Dr. Arlana Pouch on 09/02 nephrology, Dr. Cherylann Ratel in 2 weeks, neurology in 4-6 weeks. The patient has to apply the event monitor for 30 days which will be mailed from Dr. Jari Sportsman office.   Plan of care were discussed with the patient. She is aware of the plan.    TOTAL TIME SPENT ON THE DISCHARGE: 45 minutes.    ____________________________ Ramonita Lab, MD ag:at D: 08/10/2014 15:22:05 ET T: 08/10/2014 18:10:44 ET JOB#: [id number]  cc:  Ramonita Lab, MD, <Dictator> Ramonita Lab MD ELECTRONICALLY SIGNED 08/12/2014 16:23

## 2015-04-16 NOTE — Consult Note (Signed)
PATIENT NAME:  Lisa Estes, Lisa Estes MR#:  [id number] DATE OF BIRTH:  10/21/1946  DATE OF CONSULTATION:  08/07/2014  REFERRING PHYSICIAN:   CONSULTING PHYSICIAN:  Pauletta BrownsYuriy Kinzi Frediani, MD  REASON FOR CONSULTATION:  Stroke.  HISTORY OF PRESENT ILLNESS:  A 69 year old female with past medical history of diabetes, hypertension, hyperlipidemia, not taking any medications for the past 3 months because she states she ran out of them.  Presents with a left facial droop and dysarthric speech that are still present on current exam. In the Emergency Department, the patient was status post aspirin 324 mg. The patient is status post CAT scan of the head. There was a questionable thalamic infarct. The patient is status post MRI of the brain, MRI of the brain consistent with posterior vessel disease, but she has multiple infarcts in the pons midbrain, and right periventricular region; questionable cardioembolic in nature. The patient has also elevated creatinine function of 2.4. The patient is not aware of her baseline. Currently asking to be discharged.   PAST MEDICAL HISTORY: Diabetes, hypertension, and hyperlipidemia.   PAST SURGICAL HISTORY: Hysterectomy and bilateral cataract surgery.   HOME MEDICATIONS: Did not take any medications for the past 3 months.   REVIEW OF SYSTEMS: Denies any fever, chills, fatigue, weakness. Denies any blurry vision, double vision. Denies any chest pain. No palpitations.  Denies any heat or cold intolerance. Denies any weakness on one side of the body compared to the other. Denies any anxiety or depression.   IMAGING AND LABORATORY: Has been reviewed. MRI as described above.   PHYSICAL EXAMINATION:  VITAL SIGNS: Include a temperature of 97.6, pulse 78, respirations 20, blood pressure 187/79.  NEUROLOGIC: The patient is alert, awake, and oriented to place, time and location. Speech appears to be dysarthric. Extraocular movements intact. Pupils 4 to 2, reactive bilaterally. Facial  sensation intact. Facial: Motor left facial droop is present. Tongue is in midline. Uvula elevates symmetrically. Shoulder shrug intact. Motor 5/5 bilateral upper and lower extremities. Reflexes severely diminished and absent in the lower extremities. Gait and coordination: Finger-to-nose intact. Gait not assessed. The patient's strength and lower extremities is 4/5, which is chronic per patient.   IMPRESSION: A 69 year old female with multiple risk factors including hypertension, hyperlipidemia, diabetes, not on medications for the past 3 months, presenting with dysarthria and left facial droop, which has not resolved.  MRI revealing multiple posterior vascular infarcts in the midbrain pons and right periventricular region.  Elevated creatinine function. I suspect that the source of the stroke is cardioembolic in nature   PLAN: Continue hydration. Creatinine improved to 2.23 from 2.4 yesterday.  Blood pressure controlled. Would prefer systolic blood pressure below [id number], but do not drop her down. Would prefer probably 150 to 180 range as the patient is chronically hypertensive was not on any hypertensive medication.  Since unable to obtain CTA of head, we will obtain MRA of brain to look for vessels and possibility of intracranial stenosis. Continue the antiplatelet therapies and statin.  If TTE does not show any thrombus. I think this patient would need TEE, which in likely it can be done as an outpatient. If that proves ineffective and showing any thrombus or  intracardiac clot, would likely need a Holter monitor or loop recorder as outpatient. This case was discussed with patient and patient's family at bedside.   Thank you. It was a pleasure seeing this patient.    ____________________________ Pauletta BrownsYuriy Manolito Jurewicz, MD yz:TT D: 08/07/2014 15:39:06 ET T: 08/07/2014 16:05:22 ET JOB#: [id number]  cc: Pauletta Browns, MD, <Dictator> Pauletta Browns MD ELECTRONICALLY SIGNED 08/27/2014 16:06
